# Patient Record
Sex: Female | Born: 2005 | Race: Black or African American | Hispanic: No | Marital: Single | Smoking: Never smoker
Health system: Southern US, Community
[De-identification: ages and names within clinical notes are randomized; demographics above are authoritative.]

---
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
## 2023-08-13 ENCOUNTER — Ambulatory Visit
Admission: EM | Admit: 2023-08-13 | Discharge: 2023-08-13 | Disposition: A | Discharge status: Home / Self Care | Payer: BC Managed Care – PPO | Attending: Internal Medicine | Admitting: Internal Medicine

## 2023-08-13 ENCOUNTER — Other Ambulatory Visit: Payer: Self-pay

## 2023-08-13 DIAGNOSIS — T7840XA Allergy, unspecified, initial encounter: Secondary | ICD-10-CM

## 2023-08-13 DIAGNOSIS — H02849 Edema of unspecified eye, unspecified eyelid: Secondary | ICD-10-CM | POA: Diagnosis not present

## 2023-08-13 MED ORDER — DIPHENHYDRAMINE HCL 50 MG/ML IJ SOLN
25.0000 mg | Freq: Once | INTRAMUSCULAR | Status: AC
  Administered 2023-08-13: 25 mg via INTRAMUSCULAR

## 2023-08-13 MED ORDER — DEXAMETHASONE SODIUM PHOSPHATE 10 MG/ML IJ SOLN
10.0000 mg | Freq: Once | INTRAMUSCULAR | Status: AC
  Administered 2023-08-13: 10 mg via INTRAMUSCULAR

## 2023-08-13 NOTE — ED Provider Notes (Signed)
[facility]    CSN: [id number] Arrival date & time: 08/13/23  1226      History   Chief Complaint Chief Complaint  Patient presents with   Facial Swelling    HPI Victoria Chambers is a 17 y.o. female.   Patient presents with bilateral upper eyelid swelling that started yesterday.  Reports it is itchy as well.  Denies trauma or foreign body to the eyes.  She does wear glasses but denies that she wears contacts.  Denies any drainage from the eyes.  Denies any blurry vision.  She reports that she took an allergy medication for symptoms yesterday with no improvement.  Denies any changes to the environment that could have caused symptoms.  Denies any feelings of throat closing or shortness of breath.     History reviewed. No pertinent past medical history.  There are no problems to display for this patient.   History reviewed. No pertinent surgical history.  OB History   No obstetric history on file.      Home Medications    Prior to Admission medications   Not on File    Family History History reviewed. No pertinent family history.  Social History Social History   Tobacco Use   Smoking status: Never    Passive exposure: Never   Smokeless tobacco: Never  Vaping Use   Vaping status: Never Used  Substance Use Topics   Alcohol use: Never   Drug use: Never     Allergies   Patient has no known allergies.   Review of Systems Review of Systems Per HPI  Physical Exam Triage Vital Signs ED Triage Vitals  Encounter Vitals Group     BP 08/13/23 1338 114/78     Systolic BP Percentile --      Diastolic BP Percentile --      Pulse Rate 08/13/23 1338 97     Resp 08/13/23 1338 16     Temp 08/13/23 1338 97.8 F (36.6 C)     Temp Source 08/13/23 1338 Oral     SpO2 08/13/23 1338 98 %     Weight --      Height --      Head Circumference --      Peak Flow --      Pain Score 08/13/23 1339 0     Pain Loc --      Pain Education --       Exclude from Growth Chart --    No data found.  Updated Vital Signs BP 114/78 (BP Location: Left Arm)   Pulse 97   Temp 97.8 F (36.6 C) (Oral)   Resp 16   LMP  (LMP Unknown)   SpO2 98%   Visual Acuity Right Eye Distance:   Left Eye Distance:   Bilateral Distance:    Right Eye Near:   Left Eye Near:    Bilateral Near:     Physical Exam Constitutional:      General: She is not in acute distress.    Appearance: Normal appearance. She is not toxic-appearing or diaphoretic.  HENT:     Head: Normocephalic and atraumatic.  Eyes:     General: Lids are everted, no foreign bodies appreciated. Vision grossly intact. Gaze aligned appropriately.     Extraocular Movements: Extraocular movements intact.     Conjunctiva/sclera: Conjunctivae normal.     Pupils: Pupils are equal, round, and reactive to light.     Comments: Mild bilateral upper eyelid swelling with mild  erythema and scaly rash.  Pulmonary:     Effort: Pulmonary effort is normal.  Neurological:     General: No focal deficit present.     Mental Status: She is alert and oriented to person, place, and time. Mental status is at baseline.  Psychiatric:        Mood and Affect: Mood normal.        Behavior: Behavior normal.        Thought Content: Thought content normal.        Judgment: Judgment normal.      UC Treatments / Results  Labs (all labs ordered are listed, but only abnormal results are displayed) Labs Reviewed - No data to display  EKG   Radiology No results found.  Procedures Procedures (including critical care time)  Medications Ordered in UC Medications  dexamethasone (DECADRON) injection 10 mg (10 mg Intramuscular Given 08/13/23 1414)  diphenhydrAMINE (BENADRYL) injection 25 mg (25 mg Intramuscular Given 08/13/23 1414)    Initial Impression / Assessment and Plan / UC Course  I have reviewed the triage vital signs and the nursing notes.  Pertinent labs & imaging results that were available  during my care of the patient were reviewed by me and considered in my medical decision making (see chart for details).     Differential diagnoses include allergic reaction versus eyelid dermatitis.  Will treat with IM Benadryl and IM Decadron today.  Advised patient Benadryl can make her drowsy and she does have a driver here today to transport her home.  There are no signs of anaphylaxis on exam.  Advised strict follow-up precautions if any symptoms persist or worsen.  Patient verbalized understanding and was agreeable with plan. Final Clinical Impressions(s) / UC Diagnoses   Final diagnoses:  Swelling of eyelid, unspecified laterality  Allergic reaction, initial encounter     Discharge Instructions      Suspect that you are having some form of an allergic reaction causing your eyelid swelling so you were given a steroid shot and a Benadryl shot today in urgent care.  If symptoms persist or worsen, please follow-up for further evaluation.    ED Prescriptions   None    PDMP not reviewed this encounter.   Gustavus Bryant, [place] 08/13/23 [phone]

## 2023-08-13 NOTE — ED Triage Notes (Signed)
Pt states bilateral eye lid swelling and itching since yesterday.

## 2023-08-13 NOTE — Discharge Instructions (Signed)
Suspect that you are having some form of an allergic reaction causing your eyelid swelling so you were given a steroid shot and a Benadryl shot today in urgent care.  If symptoms persist or worsen, please follow-up for further evaluation.

## 2024-03-30 ENCOUNTER — Encounter (HOSPITAL_COMMUNITY): Payer: Self-pay | Admitting: Emergency Medicine

## 2024-03-30 ENCOUNTER — Emergency Department (HOSPITAL_COMMUNITY)
Admission: EM | Admit: 2024-03-30 | Discharge: 2024-03-30 | Disposition: A | Discharge status: Home / Self Care | Attending: Emergency Medicine | Admitting: Emergency Medicine

## 2024-03-30 DIAGNOSIS — H02849 Edema of unspecified eye, unspecified eyelid: Secondary | ICD-10-CM

## 2024-03-30 DIAGNOSIS — R21 Rash and other nonspecific skin eruption: Secondary | ICD-10-CM | POA: Insufficient documentation

## 2024-03-30 DIAGNOSIS — R220 Localized swelling, mass and lump, head: Secondary | ICD-10-CM | POA: Diagnosis present

## 2024-03-30 MED ORDER — DEXAMETHASONE SODIUM PHOSPHATE 10 MG/ML IJ SOLN
10.0000 mg | Freq: Once | INTRAMUSCULAR | Status: AC
  Administered 2024-03-30: 10 mg via INTRAMUSCULAR
  Filled 2024-03-30: qty 1

## 2024-03-30 MED ORDER — DIPHENHYDRAMINE HCL 25 MG PO CAPS
25.0000 mg | ORAL_CAPSULE | Freq: Once | ORAL | Status: AC
  Administered 2024-03-30: 25 mg via ORAL
  Filled 2024-03-30: qty 1

## 2024-03-30 NOTE — Discharge Instructions (Addendum)
 Would avoid anything other than water and gentle soap on the face for now until back to normal.  Can continue benadryl  and cool compresses at home. Follow-up with your doctor.  If ongoing issues, may need to see dermatology. Return here for new concerns.

## 2024-03-30 NOTE — ED Triage Notes (Signed)
 Pt reports eye swelling bilaterally and eye lids itching. Similar reaction like this last year. No issues with breathing or oral swelling. Pt took loratadine around 8:30 pm. No benadryl .

## 2024-03-30 NOTE — ED Provider Notes (Signed)
 [place] [facility] AT [facility] Provider Note   CSN: [id number] Arrival date & time: 03/30/24  0006     Patient presents with: Facial Swelling   Victoria Chambers is a 18 y.o. female.   The history is provided by the patient and medical records.   18 year old female presenting to the ED with rash of bilateral upper eyelids.  States that started yesterday evening around 9 PM but is steadily worsening.  She did use a new sunscreen from [employer] that may have caused this.  She denies any other changes in soaps, detergent, make-up, etc.  Had a similar reaction last year that was treated with steroids and Benadryl  and improved.  States eyes are starting to itch.  She denies any visual disturbance.  Prior to Admission medications   Not on File    Allergies: Patient has no known allergies.    Review of Systems  Skin:  Positive for rash.  All other systems reviewed and are negative.   Updated Vital Signs BP 112/84   Pulse 80   Temp 97.9 F (36.6 C) (Oral)   Resp 17   LMP 03/23/2024   SpO2 100%   Physical Exam Vitals and nursing note reviewed.  Constitutional:      Appearance: She is well-developed.  HENT:     Head: Normocephalic and atraumatic.   Eyes:     Conjunctiva/sclera: Conjunctivae normal.     Pupils: Pupils are equal, round, and reactive to light.     Comments: Slight puffiness of bilateral upper eyelids, splotchy appearing rash, no drainage or tearing, EOMs intact and non-painful   Cardiovascular:     Rate and Rhythm: Normal rate and regular rhythm.     Heart sounds: Normal heart sounds.  Pulmonary:     Effort: Pulmonary effort is normal.     Breath sounds: Normal breath sounds.  Abdominal:     General: Bowel sounds are normal.     Palpations: Abdomen is soft.   Musculoskeletal:        General: Normal range of motion.     Cervical back: Normal range of motion.   Skin:    General: Skin is warm and dry.   Neurological:      Mental Status: She is alert and oriented to person, place, and time.     (all labs ordered are listed, but only abnormal results are displayed) Labs Reviewed - No data to display  EKG: None  Radiology: No results found.   Procedures   Medications Ordered in the ED  diphenhydrAMINE  (BENADRYL ) capsule 25 mg (25 mg Oral Given 03/30/24 0257)  dexamethasone  (DECADRON ) injection 10 mg (10 mg Intramuscular Given 03/30/24 0256)                                    Medical Decision Making Risk Prescription drug management.   18 year old female here with swelling and itching to bilateral upper eyelids.  Did use a new sunscreen recently which may be causative agent.  She has some mild puffiness on exam and a splotchy appearing rash.  She has no drainage from the eye, EOMs intact without pain.  No visual disturbance.  Does seem to be allergic component.  She was given dose of Decadron  here and oral Benadryl .  Will need to continue antihistamines at home, advised to avoid anything other than gentle cleanser on the face for now.  Can follow-up with  PCP.  If ongoing issues, may need to see dermatology.  Can return here for new concerns.  Final diagnoses:  Swelling of eyelid, unspecified laterality    ED Discharge Orders     None          Jarold Olam HERO, PA-C 03/30/24 0334    Bari Charmaine FALCON, MD 04/01/24 [phone]

## 2024-06-08 ENCOUNTER — Ambulatory Visit: Admitting: Internal Medicine
